# Patient Record
Sex: Male | Born: 1965 | Race: White | Hispanic: No | Marital: Married | Smoking: Never smoker
Health system: Southern US, Community
[De-identification: ages and names within clinical notes are randomized; demographics above are authoritative.]

## PROBLEM LIST (undated history)

## (undated) DIAGNOSIS — E785 Hyperlipidemia, unspecified: Secondary | ICD-10-CM

## (undated) DIAGNOSIS — N201 Calculus of ureter: Secondary | ICD-10-CM

## (undated) DIAGNOSIS — K219 Gastro-esophageal reflux disease without esophagitis: Secondary | ICD-10-CM

## (undated) DIAGNOSIS — I10 Essential (primary) hypertension: Secondary | ICD-10-CM

## (undated) DIAGNOSIS — Z87442 Personal history of urinary calculi: Secondary | ICD-10-CM

## (undated) HISTORY — DX: Essential (primary) hypertension: I10

## (undated) HISTORY — DX: Hyperlipidemia, unspecified: E78.5

---

## 1999-05-26 ENCOUNTER — Encounter: Payer: Self-pay | Admitting: *Deleted

## 1999-05-26 ENCOUNTER — Encounter: Admission: RE | Admit: 1999-05-26 | Discharge: 1999-05-26 | Payer: Self-pay | Admitting: *Deleted

## 2003-09-02 ENCOUNTER — Ambulatory Visit (HOSPITAL_BASED_OUTPATIENT_CLINIC_OR_DEPARTMENT_OTHER): Admission: RE | Admit: 2003-09-02 | Discharge: 2003-09-02 | Payer: Self-pay | Admitting: Urology

## 2003-09-02 HISTORY — PX: OTHER SURGICAL HISTORY: SHX169

## 2005-02-13 HISTORY — PX: EXTRACORPOREAL SHOCK WAVE LITHOTRIPSY: SHX1557

## 2008-08-13 ENCOUNTER — Ambulatory Visit (HOSPITAL_COMMUNITY): Admission: RE | Admit: 2008-08-13 | Discharge: 2008-08-13 | Payer: Self-pay | Admitting: Urology

## 2010-05-29 IMAGING — CR DG ABDOMEN 1V
1 series · 1 of 1 positions shown · non-contrast
Comparison: None

CLINICAL DATA: Preop, left ureteral stone

ABDOMEN - 1 VIEW

[t abdomen supine]
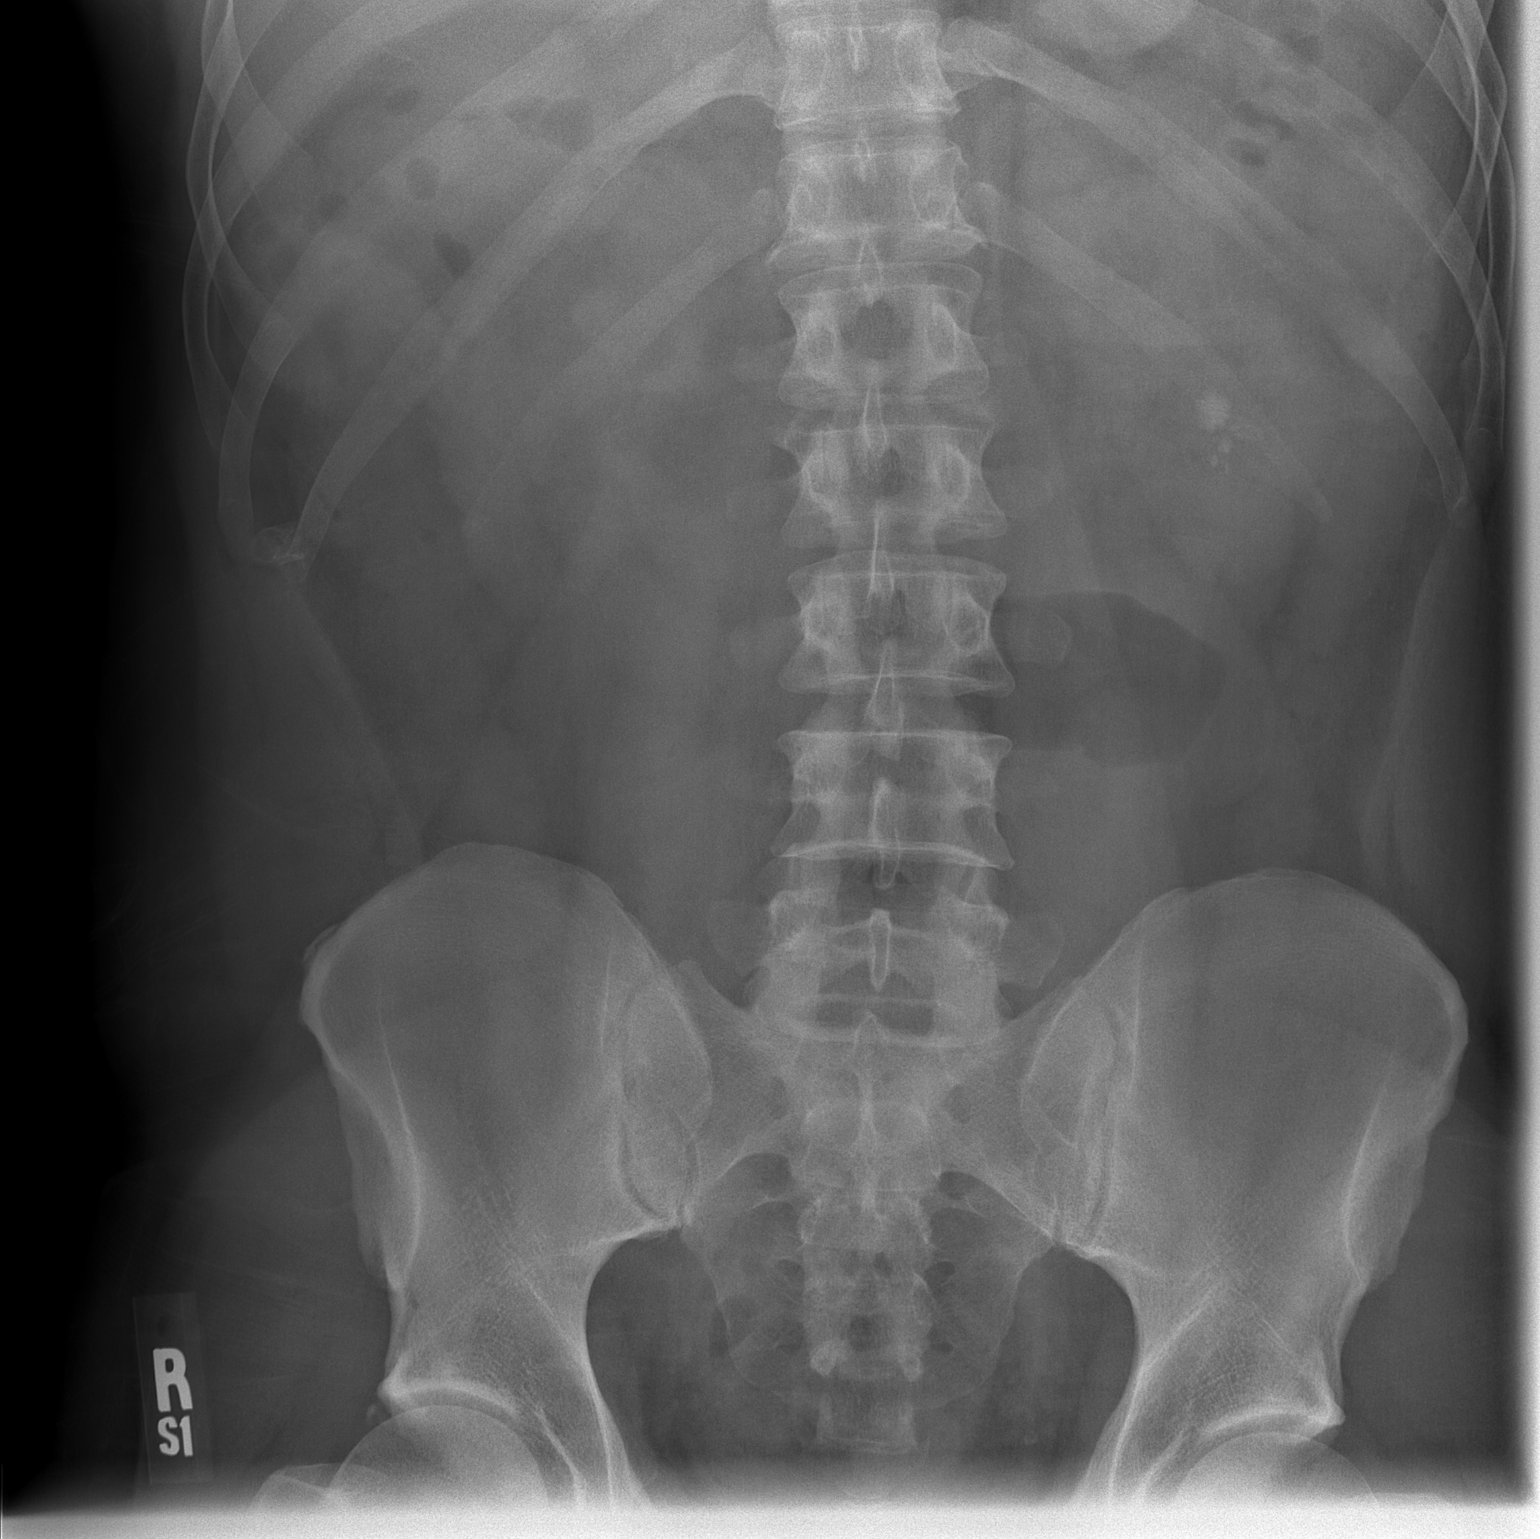

[1 of 1 positions shown; findings below may reference images not displayed]

FINDINGS: There is a cluster of calculi within the lower pole of
the left kidney the largest of which measures 11 mm.  No evidence
of ureteral lithiasis.  The most inferior pelvis is excluded from
the film.  No evidence of right renal calculi.
IMPRESSION: Left nephrolithiasis.

## 2010-07-01 NOTE — Op Note (Signed)
NAMELADON, VANDENBERGHE                           ACCOUNT NO.:  [id number]   MEDICAL RECORD NO.:  [id number]                   PATIENT TYPE:  AMB   LOCATION:  [facility]                                 FACILITY:  [facility]   PHYSICIAN:  Maretta Bees. Vonita Moss, M.D.             DATE OF BIRTH:  12/16/1965   DATE OF PROCEDURE:  09/02/2003  DATE OF DISCHARGE:                                 OPERATIVE REPORT   PREOPERATIVE DIAGNOSES:  Left ureteral calculus.   POSTOPERATIVE DIAGNOSES:  Left ureteral calculus.   PROCEDURE:  Cystoscopy, left ureteroscopy, stone manipulation, left  retrograde pyelogram with interpretation, left double J catheter placement.   SURGEON:  Maretta Bees. Vonita Moss, M.D.   ANESTHESIA:  General.   INDICATIONS FOR PROCEDURE:  This 45 year old white male has had left flank  pain which began earlier this week and he had a 5 mm stone in the distal  left ureter. He has passed some tiny stone fragments but has had persistent  flank pain and is brought to the OR today for further evaluation.   DESCRIPTION OF PROCEDURE:  The patient is brought to the operating room,  placed in lithotomy position, external genitalia were prepped and draped in  the usual fashion. He was cystoscoped, the anterior urethra and prostatic  urethra were unremarkable.  A very tiny stone fragment was seen floating in  the bladder.  I then passed a guidewire up the distal left ureter and with  the guidewire in place, I inserted the 6 French ureteroscope and saw no  stones in the distal ureter. Under fluoroscopic control, I advanced the  guidewire and it ran into a small linear stone in the very upper left  ureter. The metal guidewire would not go pass the stone but after using an  open ended ureteral catheter placed over the guidewire and then using a  Glidewire, the wire was advanced in the renal pelvis and the stone was  manipulated back into the renal pelvis. I then reinserted the ureteroscope  and went all the way  up just short of the UPJ and did not see the stone and  then passed a nitinol stone basket into the renal pelvis and brought it down  and did not retrieve any stony material with that maneuver. I felt the stone  was now manipulated back in the renal pelvis and has a cluster of stones and  elected at this point to put in a 6 [place] multicoil Kwart ureteral catheter  which I did which coiled nicely in the renal pelvis with a full coil in the  bladder and a string brought out per urethra after which the cystoscope  which I had reinserted was used to drain the bladder and it was subsequently  removed. The patient was sent to the recovery room in good condition with  the strings and double J taped to the dorsum of the penis.  Maretta Bees. Vonita Moss, M.D.    LJP/MEDQ  D:  09/02/2003  T:  09/02/2003  Job:  [id number]

## 2014-12-16 ENCOUNTER — Other Ambulatory Visit: Payer: Self-pay | Admitting: Family Medicine

## 2014-12-16 ENCOUNTER — Ambulatory Visit
Admission: RE | Admit: 2014-12-16 | Discharge: 2014-12-16 | Disposition: A | Discharge status: Home / Self Care | Payer: BC Managed Care – PPO | Source: Ambulatory Visit | Attending: Family Medicine | Admitting: Family Medicine

## 2014-12-16 DIAGNOSIS — M25512 Pain in left shoulder: Secondary | ICD-10-CM

## 2015-06-21 ENCOUNTER — Other Ambulatory Visit: Payer: Self-pay | Admitting: Urology

## 2015-06-21 ENCOUNTER — Encounter (HOSPITAL_BASED_OUTPATIENT_CLINIC_OR_DEPARTMENT_OTHER): Payer: Self-pay | Admitting: *Deleted

## 2015-06-21 NOTE — Progress Notes (Signed)
NPO AFTER MN.  ARRIVE AT [id number].  NEEDS HG AND KUB.  MAY TAKE PAIN RX AM DOS W/ SIPS OF WATER .

## 2015-06-28 ENCOUNTER — Encounter (HOSPITAL_BASED_OUTPATIENT_CLINIC_OR_DEPARTMENT_OTHER): Payer: Self-pay | Admitting: *Deleted

## 2015-06-28 ENCOUNTER — Ambulatory Visit (HOSPITAL_BASED_OUTPATIENT_CLINIC_OR_DEPARTMENT_OTHER)
Admission: RE | Admit: 2015-06-28 | Discharge: 2015-06-28 | Disposition: A | Discharge status: Home / Self Care | Payer: BC Managed Care – PPO | Source: Ambulatory Visit | Attending: Urology | Admitting: Urology

## 2015-06-28 ENCOUNTER — Ambulatory Visit (HOSPITAL_BASED_OUTPATIENT_CLINIC_OR_DEPARTMENT_OTHER): Payer: BC Managed Care – PPO | Admitting: Anesthesiology

## 2015-06-28 ENCOUNTER — Encounter (HOSPITAL_BASED_OUTPATIENT_CLINIC_OR_DEPARTMENT_OTHER)
Admission: RE | Disposition: A | Discharge status: Home / Self Care | Payer: Self-pay | Source: Ambulatory Visit | Attending: Urology

## 2015-06-28 ENCOUNTER — Ambulatory Visit (HOSPITAL_COMMUNITY): Payer: BC Managed Care – PPO

## 2015-06-28 DIAGNOSIS — N201 Calculus of ureter: Secondary | ICD-10-CM | POA: Diagnosis present

## 2015-06-28 DIAGNOSIS — Z79899 Other long term (current) drug therapy: Secondary | ICD-10-CM | POA: Diagnosis not present

## 2015-06-28 DIAGNOSIS — K219 Gastro-esophageal reflux disease without esophagitis: Secondary | ICD-10-CM | POA: Insufficient documentation

## 2015-06-28 DIAGNOSIS — Z87442 Personal history of urinary calculi: Secondary | ICD-10-CM | POA: Insufficient documentation

## 2015-06-28 DIAGNOSIS — Z841 Family history of disorders of kidney and ureter: Secondary | ICD-10-CM | POA: Diagnosis not present

## 2015-06-28 DIAGNOSIS — Z79891 Long term (current) use of opiate analgesic: Secondary | ICD-10-CM | POA: Diagnosis not present

## 2015-06-28 HISTORY — PX: CYSTOSCOPY WITH RETROGRADE PYELOGRAM, URETEROSCOPY AND STENT PLACEMENT: SHX5789

## 2015-06-28 HISTORY — DX: Personal history of urinary calculi: Z87.442

## 2015-06-28 HISTORY — DX: Gastro-esophageal reflux disease without esophagitis: K21.9

## 2015-06-28 HISTORY — DX: Calculus of ureter: N20.1

## 2015-06-28 LAB — POCT HEMOGLOBIN-HEMACUE: Hemoglobin: 14.1 g/dL (ref 13.0–17.0)

## 2015-06-28 SURGERY — CYSTOURETEROSCOPY, WITH RETROGRADE PYELOGRAM AND STENT INSERTION
Anesthesia: General | Site: Ureter | Laterality: Left

## 2015-06-28 MED ORDER — DEXAMETHASONE SODIUM PHOSPHATE 10 MG/ML IJ SOLN
INTRAMUSCULAR | Status: AC
  Filled 2015-06-28: qty 1

## 2015-06-28 MED ORDER — KETOROLAC TROMETHAMINE 30 MG/ML IJ SOLN
INTRAMUSCULAR | Status: DC | PRN
  Administered 2015-06-28: 30 mg via INTRAVENOUS

## 2015-06-28 MED ORDER — CIPROFLOXACIN IN D5W 400 MG/200ML IV SOLN
INTRAVENOUS | Status: AC
  Filled 2015-06-28: qty 200

## 2015-06-28 MED ORDER — TAMSULOSIN HCL 0.4 MG PO CAPS
0.4000 mg | ORAL_CAPSULE | Freq: Once | ORAL | Status: AC
  Administered 2015-06-28: 0.4 mg via ORAL
  Filled 2015-06-28: qty 1

## 2015-06-28 MED ORDER — SODIUM CHLORIDE 0.9 % IR SOLN
Status: DC | PRN
  Administered 2015-06-28: 3000 mL
  Administered 2015-06-28: 1000 mL

## 2015-06-28 MED ORDER — MIDAZOLAM HCL 5 MG/5ML IJ SOLN
INTRAMUSCULAR | Status: DC | PRN
  Administered 2015-06-28: 2 mg via INTRAVENOUS

## 2015-06-28 MED ORDER — KETOROLAC TROMETHAMINE 30 MG/ML IJ SOLN
INTRAMUSCULAR | Status: AC
  Filled 2015-06-28: qty 1

## 2015-06-28 MED ORDER — IOPAMIDOL (ISOVUE-370) INJECTION 76%
INTRAVENOUS | Status: DC | PRN
  Administered 2015-06-28: 9 mL

## 2015-06-28 MED ORDER — PHENAZOPYRIDINE HCL 200 MG PO TABS
200.0000 mg | ORAL_TABLET | Freq: Once | ORAL | Status: AC
  Administered 2015-06-28: 200 mg via ORAL
  Filled 2015-06-28: qty 1

## 2015-06-28 MED ORDER — LIDOCAINE HCL (CARDIAC) 20 MG/ML IV SOLN
INTRAVENOUS | Status: DC | PRN
  Administered 2015-06-28: 60 mg via INTRAVENOUS

## 2015-06-28 MED ORDER — LIDOCAINE HCL 2 % EX GEL
CUTANEOUS | Status: DC | PRN
  Administered 2015-06-28: 1 via URETHRAL

## 2015-06-28 MED ORDER — FENTANYL CITRATE (PF) 100 MCG/2ML IJ SOLN
INTRAMUSCULAR | Status: DC | PRN
  Administered 2015-06-28: 75 ug via INTRAVENOUS
  Administered 2015-06-28: 25 ug via INTRAVENOUS

## 2015-06-28 MED ORDER — LACTATED RINGERS IV SOLN
INTRAVENOUS | Status: DC
  Administered 2015-06-28 (×2): via INTRAVENOUS
  Filled 2015-06-28: qty 1000

## 2015-06-28 MED ORDER — ONDANSETRON HCL 4 MG/2ML IJ SOLN
INTRAMUSCULAR | Status: AC
  Filled 2015-06-28: qty 2

## 2015-06-28 MED ORDER — OXYCODONE HCL 5 MG PO TABS
10.0000 mg | ORAL_TABLET | ORAL | Status: DC | PRN
  Filled 2015-06-28: qty 2

## 2015-06-28 MED ORDER — MIDAZOLAM HCL 2 MG/2ML IJ SOLN
INTRAMUSCULAR | Status: AC
  Filled 2015-06-28: qty 2

## 2015-06-28 MED ORDER — LIDOCAINE HCL (CARDIAC) 20 MG/ML IV SOLN
INTRAVENOUS | Status: AC
  Filled 2015-06-28: qty 5

## 2015-06-28 MED ORDER — CIPROFLOXACIN IN D5W 400 MG/200ML IV SOLN
400.0000 mg | INTRAVENOUS | Status: AC
  Administered 2015-06-28: 400 mg via INTRAVENOUS
  Filled 2015-06-28: qty 200

## 2015-06-28 MED ORDER — PROPOFOL 10 MG/ML IV BOLUS
INTRAVENOUS | Status: DC | PRN
  Administered 2015-06-28: 190 mg via INTRAVENOUS

## 2015-06-28 MED ORDER — TAMSULOSIN HCL 0.4 MG PO CAPS
ORAL_CAPSULE | ORAL | Status: AC
  Filled 2015-06-28: qty 1

## 2015-06-28 MED ORDER — FENTANYL CITRATE (PF) 100 MCG/2ML IJ SOLN
INTRAMUSCULAR | Status: AC
  Filled 2015-06-28: qty 2

## 2015-06-28 MED ORDER — DEXAMETHASONE SODIUM PHOSPHATE 4 MG/ML IJ SOLN
INTRAMUSCULAR | Status: DC | PRN
  Administered 2015-06-28: 10 mg via INTRAVENOUS

## 2015-06-28 MED ORDER — PHENAZOPYRIDINE HCL 200 MG PO TABS: 200.0000 mg | ORAL_TABLET | Freq: Three times a day (TID) | ORAL | Status: DC | PRN

## 2015-06-28 MED ORDER — PHENAZOPYRIDINE HCL 100 MG PO TABS
ORAL_TABLET | ORAL | Status: AC
  Filled 2015-06-28: qty 2

## 2015-06-28 MED ORDER — PROPOFOL 10 MG/ML IV BOLUS
INTRAVENOUS | Status: AC
  Filled 2015-06-28: qty 20

## 2015-06-28 MED ORDER — ONDANSETRON HCL 4 MG/2ML IJ SOLN
INTRAMUSCULAR | Status: DC | PRN
  Administered 2015-06-28: 4 mg via INTRAVENOUS

## 2015-06-28 SURGICAL SUPPLY — 32 items
ADAPTER CATH URET PLST 4-6FR (CATHETERS) IMPLANT
BAG DRAIN URO-CYSTO SKYTR STRL (DRAIN) ×3 IMPLANT
BASKET DAKOTA 1.9FR 11X120 (BASKET) ×3 IMPLANT
BASKET LASER NITINOL 1.9FR (BASKET) IMPLANT
BASKET ZERO TIP NITINOL 2.4FR (BASKET) IMPLANT
CATH INTERMIT  6FR 70CM (CATHETERS) ×3 IMPLANT
CATH URET 5FR 28IN CONE TIP (BALLOONS)
CATH URET 5FR 70CM CONE TIP (BALLOONS) IMPLANT
CLOTH BEACON ORANGE TIMEOUT ST (SAFETY) ×3 IMPLANT
FIBER LASER TRAC TIP (UROLOGICAL SUPPLIES) ×3 IMPLANT
GLOVE BIO SURGEON STRL SZ8 (GLOVE) ×3 IMPLANT
GLOVE SURG SS PI 7.5 STRL IVOR (GLOVE) ×6 IMPLANT
GOWN STRL REUS W/ TWL LRG LVL3 (GOWN DISPOSABLE) ×1 IMPLANT
GOWN STRL REUS W/ TWL XL LVL3 (GOWN DISPOSABLE) ×1 IMPLANT
GOWN STRL REUS W/TWL LRG LVL3 (GOWN DISPOSABLE) ×2
GOWN STRL REUS W/TWL XL LVL3 (GOWN DISPOSABLE) ×5 IMPLANT
GUIDEWIRE 0.038 PTFE COATED (WIRE) IMPLANT
GUIDEWIRE ANG ZIPWIRE 038X150 (WIRE) IMPLANT
GUIDEWIRE STR DUAL SENSOR (WIRE) ×3 IMPLANT
IV NS 1000ML (IV SOLUTION) ×2
IV NS 1000ML BAXH (IV SOLUTION) ×1 IMPLANT
IV NS IRRIG 3000ML ARTHROMATIC (IV SOLUTION) ×3 IMPLANT
KIT BALLIN UROMAX 15FX10 (LABEL) IMPLANT
KIT ROOM TURNOVER WOR (KITS) ×3 IMPLANT
MANIFOLD NEPTUNE II (INSTRUMENTS) ×3 IMPLANT
PACK CYSTO (CUSTOM PROCEDURE TRAY) ×3 IMPLANT
SET HIGH PRES BAL DIL (LABEL)
SHEATH ACCESS URETERAL 38CM (SHEATH) ×3 IMPLANT
STENT POLARIS 5FRX26 (STENTS) ×3 IMPLANT
TUBE CONNECTING 12'X1/4 (SUCTIONS) ×1
TUBE CONNECTING 12X1/4 (SUCTIONS) ×2 IMPLANT
WATER STERILE IRR 3000ML UROMA (IV SOLUTION) IMPLANT

## 2015-06-28 NOTE — Anesthesia Procedure Notes (Signed)
Procedure Name: LMA Insertion Date/Time: 06/28/2015 8:42 AM Performed by: Norva PavlovALLAWAY, Mahayla Haddaway G Pre-anesthesia Checklist: Patient identified, Timeout performed, Emergency Drugs available, Suction available and Patient being monitored Patient Re-evaluated:Patient Re-evaluated prior to inductionOxygen Delivery Method: Circle system utilized Preoxygenation: Pre-oxygenation with 100% oxygen Intubation Type: IV induction Ventilation: Mask ventilation without difficulty LMA: LMA inserted LMA Size: 4.0 Number of attempts: 1 Placement Confirmation: positive ETCO2 and breath sounds checked- equal and bilateral Tube secured with: Tape Dental Injury: Teeth and Oropharynx as per pre-operative assessment

## 2015-06-28 NOTE — Anesthesia Postprocedure Evaluation (Signed)
Anesthesia Post Note  Patient: Elita Quickdward B Revak  Procedure(s) Performed: Procedure(s) (LRB): CYSTOSCOPY WITH LEFT RETROGRADE PYELOGRAM, URETEROSCOPY LASER LITHO  AND STENT PLACEMENT (Left)  Patient location during evaluation: PACU Anesthesia Type: General Level of consciousness: awake and alert Pain management: pain level controlled Vital Signs Assessment: post-procedure vital signs reviewed and stable Respiratory status: spontaneous breathing, nonlabored ventilation, respiratory function stable and patient connected to nasal cannula oxygen Cardiovascular status: blood pressure returned to baseline and stable Postop Assessment: no signs of nausea or vomiting Anesthetic complications: no    Last Vitals:  Filed Vitals:   06/28/15 1015 06/28/15 1030  BP: 122/82 135/91  Pulse: 54 56  Temp:  36.7 C  Resp: 14 16    Last Pain:  Filed Vitals:   06/28/15 1039  PainSc: 0-No pain                 Cecile HearingStephen Wake Caitlyne Ingham

## 2015-06-28 NOTE — Anesthesia Preprocedure Evaluation (Addendum)
Anesthesia Evaluation  Patient identified by MRN, date of birth, ID band Patient awake    Reviewed: Allergy & Precautions, NPO status , Patient's Chart, lab work & pertinent test results  Airway Mallampati: II  TM Distance: >3 FB Neck ROM: Full    Dental  (+) Teeth Intact, Dental Advisory Given   Pulmonary neg pulmonary ROS,    Pulmonary exam normal breath sounds clear to auscultation       Cardiovascular Exercise Tolerance: Good negative cardio ROS Normal cardiovascular exam Rhythm:Regular Rate:Normal     Neuro/Psych negative neurological ROS  negative psych ROS   GI/Hepatic Neg liver ROS, GERD  Medicated,  Endo/Other  negative endocrine ROS  Renal/GU negative Renal ROSKidney stones     Musculoskeletal negative musculoskeletal ROS (+)   Abdominal   Peds  Hematology negative hematology ROS (+)   Anesthesia Other Findings Day of surgery medications reviewed with the patient.  Reproductive/Obstetrics                            Anesthesia Physical Anesthesia Plan  ASA: II  Anesthesia Plan: General   Post-op Pain Management:    Induction: Intravenous  Airway Management Planned: LMA  Additional Equipment:   Intra-op Plan:   Post-operative Plan: Extubation in OR  Informed Consent: I have reviewed the patients History and Physical, chart, labs and discussed the procedure including the risks, benefits and alternatives for the proposed anesthesia with the patient or authorized representative who has indicated his/her understanding and acceptance.   Dental advisory given  Plan Discussed with: CRNA  Anesthesia Plan Comments: (Risks/benefits of general anesthesia discussed with patient including risk of damage to teeth, lips, gum, and tongue, nausea/vomiting, allergic reactions to medications, and the possibility of heart attack, stroke and death.  All patient questions answered.   Patient wishes to proceed.)        Anesthesia Quick Evaluation

## 2015-06-28 NOTE — Op Note (Signed)
PATIENT:  Jacob Weiss  PRE-OPERATIVE DIAGNOSIS:  1. Left ureteral calculi. 2. Left renal calculi  POST-OPERATIVE DIAGNOSIS: Same  PROCEDURE:  1. Cystoscopy with left retrograde pyelogram including interpretation. 2. Left ureteroscopy with laser lithotripsy and stone extraction of left ureteral stones 3. Left renal stone extraction 4. Left double-J stent placement  SURGEON: Garnett Farm, MD  INDICATION: Jacob Weiss is a 50 year old male with a history of calculus disease who developed flank pain that was quite severe and was found to have a small left ureteral stone in the proximal ureter that had a good chance of spontaneous passage however on follow-up a second stone that was previously located in the kidney had moved into the proximal ureter as well. It was much larger. We therefore discussed the rationale behind ureteroscopy rather than lithotripsy and the ability to then addressed his remaining renal calculi. He understands and has elected to proceed with ureteroscopy.  ANESTHESIA:  General  EBL:  Minimal  DRAINS: 5 French, 26 cm Polaris stent in the left ureter (with string)  SPECIMEN:  Stone given to patient  DESCRIPTION OF PROCEDURE: The patient was taken to the major OR and placed on the table. General anesthesia was administered and then the patient was moved to the dorsal lithotomy position. The genitalia was sterilely prepped and draped. An official timeout was performed.  Initially the 23 French cystoscope with 30 lens was passed under direct vision into the bladder. The bladder was then fully inspected. It was noted be free of any tumors, stones or inflammatory lesions. Ureteral orifices were of normal configuration and position. A 6 French open-ended ureteral catheter was then passed through the cystoscope into the ureteral orifice in order to perform a left retrograde pyelogram.  A retrograde pyelogram was performed by injecting full-strength contrast up the left ureter  under direct fluoroscopic control. It revealed a filling defect in the proximal ureter consistent with the stone seen on the preoperative KUB. The remainder of the ureter was noted to be normal as was the intrarenal collecting system. I then passed a 0.038 inch floppy-tipped guidewire through the open ended catheter and into the area of the renal pelvis and this was left in place. The inner portion of a ureteral access sheath was then passed over the guidewire to gently dilate the intramural ureter. I then assembled the ureteral access sheath and passed the sheath with inner cannula over the guidewire into the upper ureter and removed the guidewire and inner cannula.  A 6 French flexible, digital ureteroscope was then passed through the access sheath and as I got to the end of the access sheath identified the smaller, distal stone. I engaged the stone with a Twisp basket and withdrew it to the access sheath but could not negotiate the stone into the sheath and therefore elected to proceed with laser lithotripsy. The 200  holmium laser fiber was used to fragment the stone. I then used the Obetz basket to extract all of the stone fragments. I then advanced the scope beyond where the first stone was located and noted the more proximal, larger stone. It was also broken into approximately 4 pieces which were extracted with the basket as well.  I then advanced the scope up into the kidney and performed a systematic inspection of each calyx. I first noted there were no tumors or worrisome lesions. I noted 2 stones in a lower pole calyx that were free-floating in these were grasped individually with the basket and removed. A third  stone was found in the middle pole calyx and it was grasped and removed as well. I then reinspected all of the calyces and noted no further stones present. There was a small, nearly pinpoint opening in a lower pole calyx that may have been a stenosed infundibulum. I did not elect to perform  any procedure on this. The remainder of the collecting system was noted to be completely free of all stones. I then backed the scope down the ureter and reinspection of the ureter ureteroscopically revealed no further stone fragments and no injury to the ureter. I therefore passed a guidewire through the ureteroscope and into the left kidney under fluoroscopy and left this in place while removing the ureteroscope and access sheath.   I then backloaded the cystoscope over the guidewire and passed the stent over the guidewire into the area of the renal pelvis. As the guidewire was removed good curl was noted in the renal pelvis. The bladder was drained and the cystoscope was then removed. 2% lidocaine jelly was inserted in the urethra and a penile clamp was applied. The string, affixed to the distal aspect of the stent, was affixed to the dorsum of the penis. The patient was then awakened and taken to the recovery room in stable and satisfactory condition. He tolerated the procedure well no intraoperative complications.  PLAN OF CARE: Discharge to home after PACU  PATIENT DISPOSITION:  PACU - hemodynamically stable.

## 2015-06-28 NOTE — H&P (Signed)
Jacob Weiss is a 50 year old male with a left ureteral stone.  Vasectomy: He underwent an uncomplicated vasectomy by Dr. Vonita MossPeterson in 7/00.    Calculus disease: He passed a left ureteral stone in 3/01. In 7/05 a CT scan revealed several left renal calculi and he underwent stenting but elected not to undergo lithotripsy at that time. His stent was removed.  Left renal ESWL 7/10   CT scan 06/15/15: 4.8 x 4 mm stone in his proximal left ureter seen on his scout film reconstruction located at the level of the L3 transverse process. Multiple left renal calculi were noted primarily in the lower pole.   Stone analysis: Calcium oxalate.    Interval history: He has done well since he was here with minimal discomfort. He said on 2 occasions he took a single pain pill and went to bed but otherwise has not had any difficulty. He has not seen his stone pass.   Past Medical History Problems  1. History of esophageal reflux (Z87.19) 2. History of kidney stones (H08.657(Z87.442)  Surgical History Problems  1. History of Renal Lithotripsy  Current Meds 1. Cinnamon CAPS;  Therapy: (Recorded:07May2010) to Recorded 2. Meprozine CAPS;  Therapy: (Recorded:19Feb2008) to Recorded 3. OxyCODONE HCl - 20 MG Oral Tablet; Take 1 tablet every 4 hours;  Therapy: 02May2017 to (Last Rx:02May2017) Ordered 4. Rapaflo 8 MG Oral Capsule; 1 po q day;  Therapy: 02May2017 to (Last Rx:02May2017) Ordered  Allergies Medication  1. No Known Drug Allergies  Family History Problems  1. Family history of Family Health Status Number Of Children   1 son 2. Family history of Nephrolithiasis 3. Family history of Urologic Disorder   kidney stones  Social History Problems  1. Denied: History of Alcohol Use (History) 2. Caffeine Use   3-4  per day 3. Marital History - Currently Married 4. Never smoker 5. 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 Denied: History of Tobacco Use  Review of  Systems Genitourinary, constitutional, skin, eye, otolaryngeal, hematologic/lymphatic, cardiovascular, pulmonary, endocrine, musculoskeletal, gastrointestinal, neurological and psychiatric system(s) were reviewed and pertinent findings if present are noted and are otherwise negative.    Vitals Vital Signs  Height: 6 ft 1 in Weight: 212 lb  BMI Calculated: 27.97 BSA Calculated: 2.21 Blood Pressure: 136 / 78 Heart Rate: 73  Physical Exam Constitutional: Well nourished and well developed . In acute distress.   ENT:. The ears and nose are normal in appearance.   Neck: The appearance of the neck is normal and no neck mass is present.   Pulmonary: No respiratory distress and normal respiratory rhythm and effort.   Cardiovascular: Heart rate and rhythm are normal . No peripheral edema.   Abdomen: The abdomen is soft and nontender. No masses are palpated. No CVA tenderness. No hernias are palpable. No hepatosplenomegaly noted.   Lymphatics: The femoral and inguinal nodes are not enlarged or tender.   Skin: Normal skin turgor, no visible rash and no visible skin lesions.   Neuro/Psych:. Mood and affect are appropriate.    Results/Data Urine  COLOR YELLOW  APPEARANCE CLEAR  SPECIFIC GRAVITY 1.025  pH 5.0  GLUCOSE NEGATIVE  BILIRUBIN NEGATIVE  KETONE NEGATIVE  BLOOD 1+  PROTEIN NEGATIVE  NITRITE NEGATIVE  LEUKOCYTE ESTERASE NEGATIVE  SQUAMOUS EPITHELIAL/HPF 0-5 HPF WBC 0-5 WBC/HPF RBC 3-10 RBC/HPF BACTERIA NONE SEEN HPF CRYSTALS NONE SEEN HPF CASTS NONE SEEN LPF Yeast NONE SEEN HPF KUB: The stone in his left ureter at the L3 level appears to  be unchanged in position. I do note a new calcification in the area of the renal pelvis that is larger and appears to be mobile.     Assessment   I told him that it appears his ureteral stone has not progressed significantly although fortunately he has not had a great deal of discomfort. What has occurred though is it looks like a  second stone has become dislodged and could potentially also pass. With the 2 stones in different locations I told them that I did not believe lithotripsy would be a good option as treating the distal stone may require another shocks that I could not also treat the renal pelvic stone and therefore this could cause obstruction. The more proximal stone can be treated with a distal ureteral stone and therefore I have recommended ureteroscopic management if he chooses to undergo intervention. We discussed continued observation and the risk as well as possibility that he still couldn't pass his stone. I discussed ureteroscopy with him which he has had in the past and we went over the procedure, its risks and palpitations and the fact that he will need to have a stent postoperatively. He is opposed to a stent as he had one in the past for 3 weeks. I told him that we could likely get the stent out in about 5 days which would allow him to go on his vacation stent free. He has elected to therefore proceed with ureteroscopic management of his stones.   Plan    He is scheduled for left ureteroscopy and laser lithotripsy of his left ureteral and renal calculi.

## 2015-06-28 NOTE — Transfer of Care (Signed)
Last Vitals:  Filed Vitals:   06/28/15 0712  BP: 138/92  Pulse: 60  Temp: 36.4 C  Resp: 16    Last Pain: There were no vitals filed for this visit.    Patients Stated Pain Goal: 3 (06/28/15 0754)  Immediate Anesthesia Transfer of Care Note  Patient: Jacob Weiss  Procedure(s) Performed: Procedure(s) (LRB): CYSTOSCOPY WITH LEFT RETROGRADE PYELOGRAM, URETEROSCOPY LASER LITHO  AND STENT PLACEMENT (Left)  Patient Location: PACU  Anesthesia Type: General  Level of Consciousness: awake, alert  and oriented  Airway & Oxygen Therapy: Patient Spontanous Breathing and Patient connected to nasal cannula oxygen  Post-op Assessment: Report given to PACU RN and Post -op Vital signs reviewed and stable  Post vital signs: Reviewed and stable  Complications: No apparent anesthesia complications

## 2015-06-28 NOTE — Discharge Instructions (Signed)

## 2015-06-29 ENCOUNTER — Encounter (HOSPITAL_BASED_OUTPATIENT_CLINIC_OR_DEPARTMENT_OTHER): Payer: Self-pay | Admitting: Urology

## 2021-08-17 ENCOUNTER — Ambulatory Visit (INDEPENDENT_AMBULATORY_CARE_PROVIDER_SITE_OTHER): Payer: BC Managed Care – PPO

## 2021-08-17 ENCOUNTER — Ambulatory Visit: Payer: BC Managed Care – PPO | Admitting: Podiatry

## 2021-08-17 ENCOUNTER — Encounter: Payer: Self-pay | Admitting: Podiatry

## 2021-08-17 DIAGNOSIS — M779 Enthesopathy, unspecified: Secondary | ICD-10-CM

## 2021-08-18 NOTE — Progress Notes (Signed)
Subjective:   Patient ID: Jacob Weiss, male   DOB: 56 y.o.   MRN: [id number]   HPI Patient presents stating that he has had some numbness in his left and follow-up with his right foot with occasional burning and was just concerned about this.  States its been short-term and he does not remember any injury and has no back injury currently.  Does not smoke likes to be active   Review of Systems  All other systems reviewed and are negative.       Objective:  Physical Exam Vitals and nursing note reviewed.  Constitutional:      Appearance: He is well-developed.  Pulmonary:     Effort: Pulmonary effort is normal.  Musculoskeletal:        General: Normal range of motion.  Skin:    General: Skin is warm.  Neurological:     Mental Status: He is alert.     Neurovascular status was found to be intact muscle strength was found to be adequate range of motion adequate.  Patient does complain about this but states it does not stop him from doing anything he has had no falls he has no balance issues and he has no trouble sleeping with only periodic discomfort which occurs but mostly just the feeling that it provides.  Good digital perfusion well oriented x3     Assessment:  Difficult to say but most likely some type of a transitory irritation which may be related to some kind of a low-grade back compression or some kind of a contusion of a subtle nature which may have occurred     Plan:  H&P spent a great deal time going over this with him and as long as it does not progress as long as it does not interfere with daily activities and as long as he does not develop further burning shooting or instability or muscle strength loss I would just put this on hold and if those things were to occur I will refer to neurologist for nerve conduction studies EMGs.  Patient will be seen back as needed  X-rays were negative for signs of arthritis signs of fracture or other bony pathology

## 2022-05-02 ENCOUNTER — Encounter: Payer: Self-pay | Admitting: Cardiology

## 2022-05-02 ENCOUNTER — Ambulatory Visit: Payer: BC Managed Care – PPO | Admitting: Cardiology

## 2022-05-02 VITALS — BP 140/82 | HR 78 | Resp 18 | Ht 73.0 in | Wt 223.8 lb

## 2022-05-02 DIAGNOSIS — R0609 Other forms of dyspnea: Secondary | ICD-10-CM

## 2022-05-02 DIAGNOSIS — E782 Mixed hyperlipidemia: Secondary | ICD-10-CM

## 2022-05-02 DIAGNOSIS — I10 Essential (primary) hypertension: Secondary | ICD-10-CM

## 2022-05-02 NOTE — Progress Notes (Signed)
ID:  Jacob Weiss, DOB [birth year], MRN [id number]  PCP:  Antony Contras, MD  Cardiologist:  Rex Kras, DO, [facility] (established care 05/02/2022)  REASON FOR CONSULT: Hypertension  REQUESTING PHYSICIAN:  Antony Contras, MD [place],  [place] [id number]  Chief Complaint  Patient presents with   Hypertension   New Patient (Initial Visit)    HPI  Jacob Weiss is a 57 y.o. Caucasian male who presents to the clinic for evaluation of hypertension at the request of Antony Contras, MD. His past medical history and cardiovascular risk factors include: Hypertension, paresthesia, GERD, hyperlipidemia.   Patient was referred to the practice for evaluation of benign essential hypertension.  He has had high blood pressure for several years and has been on hydrochlorothiazide and losartan.  His PCP has tried to uptitrate antihypertensive medications for better blood pressure goal however he has been having episodes of dizziness with up titration of losartan and/or HCTZ.  Once he reduced losartan dose back to 50 mg p.o. daily the symptoms of dizziness resolved.  He has been having some stress lately at work which may be contributory to uncontrolled hypertension.  Home blood pressure range between 127-130 mmHg, and diastolic blood pressures between 85-90 mmHg.  This is per patient I do not have a blood pressure log to review.  He is explicitly denies chest pain at rest or with effort related activities.  At times when he does go up flights of stairs he has short of breath.  However, he feels comfortable mowing his yard with a push mower.  Of note, he takes both of his blood pressures in the morning and checks his blood pressures at night.  FUNCTIONAL STATUS: Walks approximately 2 miles per week.  And maintain his lawn by using a push mower.  ALLERGIES: No Known Allergies  MEDICATION LIST PRIOR TO VISIT: Current Meds  Medication Sig   chlorpheniramine (CHLOR-TRIMETON) 4  MG tablet Take 4 mg by mouth 2 (two) times daily as needed for allergies.   famotidine (PEPCID) 20 MG tablet Take 20 mg by mouth daily.   hydrochlorothiazide (HYDRODIURIL) 12.5 MG tablet Take 12.5 mg by mouth daily.   losartan (COZAAR) 50 MG tablet Take 50 mg by mouth daily.   [DISCONTINUED] famotidine (PEPCID AC) 10 MG chewable tablet Chew 10 mg by mouth as needed for heartburn.     PAST MEDICAL HISTORY: Past Medical History:  Diagnosis Date   GERD (gastroesophageal reflux disease)    History of kidney stones    Hyperlipidemia    Hypertension    Left ureteral stone     PAST SURGICAL HISTORY: Past Surgical History:  Procedure Laterality Date   CYSTOSCOPY WITH RETROGRADE PYELOGRAM, URETEROSCOPY AND STENT PLACEMENT Left 06/28/2015   Procedure: CYSTOSCOPY WITH LEFT RETROGRADE PYELOGRAM, URETEROSCOPY LASER LITHO  AND STENT PLACEMENT;  Surgeon: Kathie Rhodes, MD;  Location: [place];  Service: Urology;  Laterality: Left;   EXTRACORPOREAL SHOCK WAVE LITHOTRIPSY  2007   LEFT URETEROSCOPIC STONE EXTRACTION W/  STENT PLACEMENT  09-02-2003    FAMILY HISTORY: The patient family history includes Hypertension in his mother; Kidney cancer in his father; Lung cancer in his sister; Stroke in his brother.  SOCIAL HISTORY:  The patient  reports that he has never smoked. He has never used smokeless tobacco. He reports that he does not drink alcohol and does not use drugs.  REVIEW OF SYSTEMS: Review of Systems  Cardiovascular:  Positive for dyspnea on exertion.  Negative for chest pain, claudication, irregular heartbeat, leg swelling, near-syncope, orthopnea, palpitations, paroxysmal nocturnal dyspnea and syncope.  Respiratory:  Negative for shortness of breath.   Hematologic/Lymphatic: Negative for bleeding problem.  Musculoskeletal:  Negative for muscle cramps and myalgias.  Neurological:  Negative for dizziness and light-headedness.    PHYSICAL EXAM:    05/02/2022    3:28 PM  05/02/2022    2:51 PM 06/28/2015   12:00 PM  Vitals with BMI  Height  6\' 1"    Weight  223 lbs 13 oz   BMI  [id number]   Systolic [id number]  Diastolic 82 87 96  Pulse 78 76 59    Physical Exam  Constitutional: No distress.  Age appropriate, hemodynamically stable.   Neck: No JVD present.  Cardiovascular: Normal rate, regular rhythm, S1 normal, S2 normal, intact distal pulses and normal pulses. Exam reveals no gallop, no S3 and no S4.  No murmur heard. Pulmonary/Chest: Effort normal and breath sounds normal. No stridor. He has no wheezes. He has no rales.  Abdominal: Soft. Bowel sounds are normal. He exhibits no distension. There is no abdominal tenderness.  Musculoskeletal:        General: No edema.     Cervical back: Neck supple.  Neurological: He is alert and oriented to person, place, and time. He has intact cranial nerves (2-12).  Skin: Skin is warm and moist.   CARDIAC DATABASE: EKG: 05/02/2022: Sinus rhythm, 71 bpm, left axis, left anterior fascicular block, LAE, without underlying injury pattern.  Echocardiogram: No results found for this or any previous visit from the past 1095 days.    Stress Testing: No results found for this or any previous visit from the past 1095 days.   Heart Catheterization: None  LABORATORY DATA: External Labs: Collected: 12//2023. Total cholesterol 230, triglycerides 79, HDL 66, LDL calculated 150, non-HDL 164. TSH 1.9. BUN 18, creatinine 1.06. Sodium 140, potassium 4, chloride 101, bicarb 31. AST 24, ALT 24, alkaline phosphatase 56. Hemoglobin 14.1, hematocrit 42.4%  IMPRESSION:    ICD-10-CM   1. Benign hypertension  I10 EKG 12-Lead    PCV ECHOCARDIOGRAM COMPLETE    2. Dyspnea on exertion  R06.09 PCV ECHOCARDIOGRAM COMPLETE    CT CARDIAC SCORING ([facility] LOCATIONS ONLY)    PCV CARDIAC STRESS TEST    3. Mixed hyperlipidemia  E78.2        RECOMMENDATIONS: Jacob Weiss is a 57 y.o. Caucasian male whose past medical history and  cardiac risk factors include: Hypertension, paresthesia, GERD, hyperlipidemia.   Benign hypertension Office blood pressures are not at goal. I suspect that his overall blood pressures need to improve. However, he is experiencing lightheadedness and dizziness with up titration of medical therapy. This is likely secondary to him taking all of his antihypertensive medications in the morning. For now I have asked him to take HCTZ in the morning and losartan at night at the current doses and to check his blood pressures twice daily to see what his blood pressures trend in an ambulatory setting. If SBP is not at goal uptitrating 1 medication at a time is quite reasonable. Reemphasized importance of a low-salt diet.  Dyspnea on exertion Likely secondary to uncontrolled hypertension; however, given his cardiovascular risk factors and age recommend echocardiogram to evaluate for LVEF and structural heart disease, exercise treadmill stress test to evaluate for functional capacity and exercise-induced ischemia, and coronary calcium score for further risk stratification.  Mixed hyperlipidemia Most recent lipid profile notes a calculated LDL  at 150 mg/dL Chooses not to be on statin therapy at this time. His ASCVD risk score is approximately 6.3% based on current parameters.  Will use a coronary calcium score for further risk stratification.  He will discuss further with PCP.  Data Reviewed: I have independently reviewed external notes provided by the referring provider as part of this office visit.   I have independently reviewed results of labs, EKG as part of medical decision making. I have ordered the following tests:  Orders Placed This Encounter  Procedures   CT CARDIAC SCORING ([facility] LOCATIONS ONLY)    Standing Status:   Future    Standing Expiration Date:   05/02/2023    Order Specific Question:   Preferred imaging location?    Answer:   [facility]    Order Specific Question:   Release to patient     Answer:   Immediate   PCV CARDIAC STRESS TEST    Standing Status:   Future    Standing Expiration Date:   05/02/2023   EKG 12-Lead   PCV ECHOCARDIOGRAM COMPLETE    Standing Status:   Future    Standing Expiration Date:   05/02/2023  I have not made medications changes at today's encounter as noted above. History of present illness was obtained by both patient and wife at today's office visit.   FINAL MEDICATION LIST END OF ENCOUNTER: No orders of the defined types were placed in this encounter.   Medications Discontinued During This Encounter  Medication Reason   silodosin (RAPAFLO) 4 MG CAPS capsule    phenazopyridine (PYRIDIUM) 200 MG tablet    oxyCODONE-acetaminophen (PERCOCET/ROXICET) 5-325 MG tablet    famotidine (PEPCID AC) 10 MG chewable tablet      Current Outpatient Medications:    chlorpheniramine (CHLOR-TRIMETON) 4 MG tablet, Take 4 mg by mouth 2 (two) times daily as needed for allergies., Disp: , Rfl:    famotidine (PEPCID) 20 MG tablet, Take 20 mg by mouth daily., Disp: , Rfl:    hydrochlorothiazide (HYDRODIURIL) 12.5 MG tablet, Take 12.5 mg by mouth daily., Disp: , Rfl:    losartan (COZAAR) 50 MG tablet, Take 50 mg by mouth daily., Disp: , Rfl:   Orders Placed This Encounter  Procedures   CT CARDIAC SCORING ([facility] LOCATIONS ONLY)   PCV CARDIAC STRESS TEST   EKG 12-Lead   PCV ECHOCARDIOGRAM COMPLETE   There are no Patient Instructions on file for this visit.   --Continue cardiac medications as reconciled in final medication list. --Return in about 6 weeks (around 06/13/2022) for Follow up, Dyspnea, BP. or sooner if needed. --Continue follow-up with your primary care physician regarding the management of your other chronic comorbid conditions.  Patient's questions and concerns were addressed to his satisfaction. He voices understanding of the instructions provided during this encounter.   This note was created using a voice recognition software as a result there  may be grammatical errors inadvertently enclosed that do not reflect the nature of this encounter. Every attempt is made to correct such errors.  Rex Kras, [place], [facility]  Pager:  [phone] Office: [phone]

## 2022-05-18 ENCOUNTER — Ambulatory Visit: Payer: BC Managed Care – PPO

## 2022-05-18 DIAGNOSIS — R0609 Other forms of dyspnea: Secondary | ICD-10-CM

## 2022-05-26 ENCOUNTER — Other Ambulatory Visit: Payer: BC Managed Care – PPO

## 2022-06-15 ENCOUNTER — Ambulatory Visit: Payer: BC Managed Care – PPO | Admitting: Cardiology

## 2022-06-15 ENCOUNTER — Encounter: Payer: Self-pay | Admitting: Cardiology

## 2022-06-15 VITALS — BP 122/83 | HR 79 | Resp 17 | Ht 73.0 in | Wt 223.2 lb

## 2022-06-15 DIAGNOSIS — R0609 Other forms of dyspnea: Secondary | ICD-10-CM

## 2022-06-15 DIAGNOSIS — I10 Essential (primary) hypertension: Secondary | ICD-10-CM

## 2022-06-15 DIAGNOSIS — E782 Mixed hyperlipidemia: Secondary | ICD-10-CM

## 2022-06-15 NOTE — Progress Notes (Signed)
ID:  Jacob Weiss, DOB April 24, 1965, MRN [id number]  PCP:  Tally Joe, MD  Cardiologist:  Tessa Lerner, DO, [facility] (established care 05/02/2022)  Date: 06/15/22 Last Office Visit: 05/02/2022  Chief Complaint  Patient presents with   Shortness of Breath   Hypertension   Follow-up    6 weeks     HPI  Jacob Weiss is a 57 y.o. 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   Patient was referred to the practice for blood pressure management.  He is working with his PCP and with titration of antihypertensive medications he would have episodes of lightheaded and dizziness.  At the last office visit I recommended that he take HCTZ in the morning and losartan at night.  This has helped improve his blood pressure on a daily basis.  Based on the blood pressure log that he provides SBP's ranging between 120-130 mmHg on average based on visual estimation.  Patient is thankful for the recommendations.  With regards to dyspnea on exertion the symptoms have improved with better blood pressure management.  He was recommended to have an echo and stress test.  Echocardiogram is still pending.  Stress test results reviewed overall low risk study.  He also had a coronary calcium score given his hyperlipidemia and he is noted to have minimal CAC with a total score of 6 placing him at the 43rd percentile per [facility] database.  FUNCTIONAL STATUS: Walks approximately 2 miles per week.  And maintain his lawn by using a push mower.  ALLERGIES: No Known Allergies  MEDICATION LIST PRIOR TO VISIT: Current Meds  Medication Sig   famotidine (PEPCID) 20 MG tablet Take 20 mg by mouth daily.   hydrochlorothiazide (HYDRODIURIL) 12.5 MG tablet Take 12.5 mg by mouth daily.   losartan (COZAAR) 50 MG tablet Take 50 mg by mouth daily.     PAST MEDICAL HISTORY: Past Medical History:  Diagnosis  Date   GERD (gastroesophageal reflux disease)    History of kidney stones    Hyperlipidemia    Hypertension    Left ureteral stone     PAST SURGICAL HISTORY: Past Surgical History:  Procedure Laterality Date   CYSTOSCOPY WITH RETROGRADE PYELOGRAM, URETEROSCOPY AND STENT PLACEMENT Left 06/28/2015   Procedure: CYSTOSCOPY WITH LEFT RETROGRADE PYELOGRAM, URETEROSCOPY LASER LITHO  AND STENT PLACEMENT;  Surgeon: Ihor Gully, MD;  Location: [facility] [place];  Service: Urology;  Laterality: Left;   EXTRACORPOREAL SHOCK WAVE LITHOTRIPSY  2007   LEFT URETEROSCOPIC STONE EXTRACTION W/  STENT PLACEMENT  09-02-2003    FAMILY HISTORY: The patient family history includes Hypertension in his mother; Kidney cancer in his father; Lung cancer in his sister; Stroke in his brother.  SOCIAL HISTORY:  The patient  reports that he has never smoked. He has never used smokeless tobacco. He reports that he does not drink alcohol and does not use drugs.  REVIEW OF SYSTEMS: Review of Systems  Cardiovascular:  Positive for dyspnea on exertion (Resolved). Negative for chest pain, claudication, irregular heartbeat, leg swelling, near-syncope, orthopnea, palpitations, paroxysmal nocturnal dyspnea and syncope.  Respiratory:  Negative for shortness of breath.   Hematologic/Lymphatic: Negative for bleeding problem.  Musculoskeletal:  Negative for muscle cramps and myalgias.  Neurological:  Negative for dizziness and light-headedness.    PHYSICAL EXAM:    06/15/2022    3:04 PM 05/02/2022    3:28 PM 05/02/2022  2:51 PM  Vitals with BMI  Height 6\' 1"   6\' 1"   Weight 223 lbs 3 oz  223 lbs 13 oz  BMI 29.45  29.53  Systolic 122 140 478  Diastolic 83 82 87  Pulse 79 78 76    Physical Exam  Constitutional: No distress.  Age appropriate, hemodynamically stable.   Neck: No JVD present.  Cardiovascular: Normal rate, regular rhythm, S1 normal, S2 normal, intact distal pulses and normal pulses. Exam reveals  no gallop, no S3 and no S4.  No murmur heard. Pulmonary/Chest: Effort normal and breath sounds normal. No stridor. He has no wheezes. He has no rales.  Abdominal: Soft. Bowel sounds are normal. He exhibits no distension. There is no abdominal tenderness.  Musculoskeletal:        General: No edema.     Cervical back: Neck supple.  Neurological: He is alert and oriented to person, place, and time. He has intact cranial nerves (2-12).  Skin: Skin is warm and moist.   CARDIAC DATABASE: EKG: 05/02/2022: Sinus rhythm, 71 bpm, left axis, left anterior fascicular block, LAE, without underlying injury pattern.  Echocardiogram: No results found for this or any previous visit from the past 1095 days.    Stress Testing: Exercise treadmill stress test 05/18/2022: Exercise treadmill stress test performed using Bruce protocol. Patient reached 10.1 METS, and 96% of age predicted maximum heart rate. Exercise capacity was good. No chest pain reported. Normal heart rate and hemodynamic response. Stress EKG revealed no ischemic changes. Low risk study.   Heart Catheterization: None  Cardiology  05/18/2022:  - Total Calcium Score: 6.  -- Left Main: 0.  -- Right Coronary: 0  -- Left Anterior Descending: 6.  -- Circumflex: 0.  -- PDA: 0  Non cardiac: irregular nodular opacity in the posterior right lung base with some associated groundglass (series 3, image 55   1.  Total calcium score of 6 is between the 25-50 percentile for males between the ages of 42-59.  2.  This is compatible with minimal identifiable coronary artery plaque.  Risk of coronary artery disease: very unlikely, less than 10%.  3.  Mixed nodular and groundglass opacities in the posterior right lung base, indeterminate though possibly infectious. Follow-up dedicated CT of the chest in 3 months is recommended.    LABORATORY DATA: External Labs: Collected: 01/2022. Total cholesterol 230, triglycerides 79, HDL 66, LDL calculated 150,  non-HDL 164. TSH 1.9. BUN 18, creatinine 1.06. Sodium 140, potassium 4, chloride 101, bicarb 31. AST 24, ALT 24, alkaline phosphatase 56. Hemoglobin 14.1, hematocrit 42.4%  IMPRESSION:    ICD-10-CM   1. Benign hypertension  I10     2. Dyspnea on exertion  R06.09     3. Mixed hyperlipidemia  E78.2        RECOMMENDATIONS: DENI LEFEVER is a 57 y.o. 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   Benign hypertension Office blood pressures are now well-controlled. Home blood pressures are also better controlled. Continue take HCTZ in the morning and losartan at night. Reemphasized importance of a low-salt diet.   No changes warranted at this time.  Dyspnea on exertion Resolved with improvement in benign essential hypertension. Echocardiogram is still pending. GXT low risk results reviewed with the patient and his wife at today's visit. CT calcium score noted nonspecific finding in the right lung results reviewed and I recommended that he follows up  with PCP and as recommended by radiology to have a repeat study in 3 months.  Both patient and wife are aware.  Mixed hyperlipidemia Most recent lipid profile notes a calculated LDL at 150 mg/dL His ASCVD risk score is approximately 6.3% based on current parameters.  Minimal coronary calcification, total CAC 6, 43rd percentile Patient wishes to hold off on statin therapy. Recommended that he implement lifestyle changes and have his cholesterol rechecked within the next 3 months with PCP to reevaluate the need for pharmacological therapy. Patient and wife are agreeable with the plan of care.  FINAL MEDICATION LIST END OF ENCOUNTER: No orders of the defined types were placed in this encounter.   There are no discontinued medications.    Current Outpatient Medications:    famotidine (PEPCID) 20 MG tablet, Take  20 mg by mouth daily., Disp: , Rfl:    hydrochlorothiazide (HYDRODIURIL) 12.5 MG tablet, Take 12.5 mg by mouth daily., Disp: , Rfl:    losartan (COZAAR) 50 MG tablet, Take 50 mg by mouth daily., Disp: , Rfl:    chlorpheniramine (CHLOR-TRIMETON) 4 MG tablet, Take 4 mg by mouth 2 (two) times daily as needed for allergies. (Patient not taking: Reported on 06/15/2022), Disp: , Rfl:   No orders of the defined types were placed in this encounter.  There are no Patient Instructions on file for this visit.   --Continue cardiac medications as reconciled in final medication list. --Return in about 1 year (around 06/15/2023) for Follow up, Dyspnea. or sooner if needed. --Continue follow-up with your primary care physician regarding the management of your other chronic comorbid conditions.  Patient's questions and concerns were addressed to his satisfaction. He voices understanding of the instructions provided during this encounter.   This note was created using a voice recognition software as a result there may be grammatical errors inadvertently enclosed that do not reflect the nature of this encounter. Every attempt is made to correct such errors.  Tessa Lerner, [place], [facility]  Pager:  [phone] Office: [phone]

## 2023-06-15 ENCOUNTER — Encounter: Payer: Self-pay | Admitting: Cardiology

## 2023-06-15 ENCOUNTER — Ambulatory Visit: Payer: Self-pay | Admitting: Cardiology

## 2023-06-15 ENCOUNTER — Ambulatory Visit: Payer: Self-pay | Attending: Cardiology | Admitting: Cardiology

## 2023-06-15 VITALS — BP 128/70 | HR 78 | Ht 73.0 in | Wt 224.0 lb

## 2023-06-15 DIAGNOSIS — R0609 Other forms of dyspnea: Secondary | ICD-10-CM

## 2023-06-15 DIAGNOSIS — E782 Mixed hyperlipidemia: Secondary | ICD-10-CM | POA: Diagnosis not present

## 2023-06-15 DIAGNOSIS — I10 Essential (primary) hypertension: Secondary | ICD-10-CM | POA: Diagnosis not present

## 2023-06-15 NOTE — Progress Notes (Signed)
 Cardiology Office Note:  .   Date:  06/15/2023  ID:  Jacob Weiss, DOB Aug 13, 1965, MRN [id number] PCP:  Rae Bugler, MD  Former Cardiology Providers: NA [facility] Providers Cardiologist:  Olinda Bertrand, DO , [facility] (established care 05/02/2022) Electrophysiologist:  None  Click to update primary MD,subspecialty MD or APP then REFRESH:1}    Chief Complaint  Patient presents with   Follow-up    1 year follow-up    History of Present Illness: .   Jacob Weiss is a 58 y.o. Caucasian male whose past medical history and cardiovascular risk factors includes:  Mild CAC (total CAC 6, 43rd percentile), hypertension, paresthesia, GERD, hyperlipidemia.   Patient was referred to the practice for evaluation and management of benign essential hypertension.  With changing how he was taking his medications patient's blood pressures improved significantly.  He was also experiencing shortness of breath with effort related activities initially felt to be secondary to uncontrolled hypertension.  However given his risk factors he did undergo GXT, and a coronary calcium score.  He has noted to have very minimal CAC with a total score of 6, placing him at the 43rd percentile, GXT was low risk.  He presents today for 1 year follow-up visit.  Since last office visit patient denies any anginal chest pain or heart failure symptoms.  No hospitalizations or urgent care visits for cardiovascular reasons.  He has been compliant with his medical therapy.  No significant weight gain.  Physical endurance remains stable.    Review of Systems: .   Review of Systems  Cardiovascular:  Negative for chest pain, claudication, irregular heartbeat, leg swelling, near-syncope, orthopnea, palpitations, paroxysmal nocturnal dyspnea and syncope.  Respiratory:  Negative for shortness of breath.   Hematologic/Lymphatic: Negative for bleeding problem.    Studies Reviewed:   EKG: EKG Interpretation Date/Time:  Friday Jun 15 2023 08:42:01 EDT Ventricular Rate:  78 PR Interval:  192 QRS Duration:  110 QT Interval:  390 QTC Calculation: 444 R Axis:   -66  Text Interpretation: Sinus rhythm with occasional Premature ventricular complexes Left anterior fascicular block Anterolateral infarct , age undetermined No previous ECGs available Confirmed by Olinda Bertrand [phone]) on 06/15/2023 8:59:19 AM  Echocardiogram: NA  Stress Testing: Exercise treadmill stress test 05/18/2022: Low risk study.  Coronary calcium score  05/18/2022:   Total Calcium Score: 6.  Non cardiac: irregular nodular opacity in the posterior right lung base with some associated groundglass (series 3, image 55   RADIOLOGY: NA  Risk Assessment/Calculations:   NA   Labs:    External Labs: Collected: 01/2022. Total cholesterol 230, triglycerides 79, HDL 66, LDL calculated 150, non-HDL 164. TSH 1.9. BUN 18, creatinine 1.06. Sodium 140, potassium 4, chloride 101, bicarb 31. AST 24, ALT 24, alkaline phosphatase 56. Hemoglobin 14.1, hematocrit 42.4%  External Labs: Collected: December 2024 [facility] database. Total cholesterol 230, triglycerides 70, HDL 65, LDL 153. Potassium 4.2. TSH 1.64  Physical Exam:    Today's Vitals   06/15/23 0843  BP: 128/70  Pulse: 78  SpO2: 97%  Weight: 224 lb (101.6 kg)  Height: 6\' 1"  (1.854 m)   Body mass index is 29.55 kg/m. Wt Readings from Last 3 Encounters:  06/15/23 224 lb (101.6 kg)  06/15/22 223 lb 3.2 oz (101.2 kg)  05/02/22 223 lb 12.8 oz (101.5 kg)    Physical Exam  Constitutional: No distress.  hemodynamically stable  Neck: No JVD present.  Cardiovascular: Normal rate, regular rhythm, S1 normal and  S2 normal. Exam reveals no gallop, no S3 and no S4.  No murmur heard. Pulmonary/Chest: Effort normal and breath sounds normal. No stridor. He has no wheezes. He has no rales.  Musculoskeletal:        General: No edema.     Cervical back: Neck supple.  Skin: Skin is warm.      Impression & Recommendation(s):  Impression:   ICD-10-CM   1. Dyspnea on exertion  R06.09 EKG 12-Lead    2. Benign hypertension  I10     3. Mixed hyperlipidemia  E78.2        Recommendation(s):  Dyspnea on exertion Chronic and stable. Significantly improved after blood pressure management. Prior GXT was low risk. Overall functional capacity remains relatively stable still uses a push mower on a regular basis to maintain his lawn. EKG illustrates sinus rhythm without underlying injury pattern.  Benign hypertension Chronic and well-controlled. Continue hydrochlorothiazide 12.5 mg p.o. every morning. Continue losartan 50 mg p.o. every afternoon. Reemphasized importance of low-salt diet.  Mixed hyperlipidemia Most recent LDL levels 153 mg/dL as per [facility] database from December 2024 Coronary calcium score in the past was 6. We have discussed lipid-lowering agents. Currently on over-the-counter medications but no significant improvement and follow-up lipid profile. I have advised him to reconsider this and discuss with PCP if he still continues to prefer over-the-counter supplements consider Red yeast rice as an alternative to pharmacological therapy.  However if Red Yeast Rice is till not effective than prescription drug management is a consideration  During his last coronary calcium score back in 2024 patient had an irregular nodular opacity in the posterior right lung base and had asked him to follow-up with PCP.  Patient still has not discussed this further with PCP.  Reemphasized its importance and follow-through.  Patient verbalizes understanding and his questions and concerns addressed to his satisfaction.   Orders Placed:  Orders Placed This Encounter  Procedures   EKG 12-Lead     Final Medication List:   No orders of the defined types were placed in this encounter.   There are no discontinued medications.   Current Outpatient Medications:    CINNAMON PO, Take  2 tablets by mouth daily., Disp: , Rfl:    famotidine (PEPCID) 20 MG tablet, Take 20 mg by mouth daily., Disp: , Rfl:    hydrochlorothiazide (HYDRODIURIL) 12.5 MG tablet, Take 12.5 mg by mouth daily., Disp: , Rfl:    losartan (COZAAR) 50 MG tablet, Take 50 mg by mouth daily., Disp: , Rfl:    Magnesium Bisglycinate 655 MG CAPS, Take 1 tablet by mouth 2 (two) times a week., Disp: , Rfl:    Multiple Vitamins-Minerals (MATURE ADULT CENTURY PO), Take 1 tablet by mouth daily., Disp: , Rfl:    NON FORMULARY, Take 2 tablets by mouth daily. Cholestoff Plus, Disp: , Rfl:    Omega-3 Fatty Acids (FISH OIL) 1000 MG CAPS, Take 2 capsules by mouth daily., Disp: , Rfl:    Thiamine HCl (VITAMIN B-1) 250 MG tablet, Take 250 mg by mouth 2 (two) times a week., Disp: , Rfl:    chlorpheniramine (CHLOR-TRIMETON) 4 MG tablet, Take 4 mg by mouth 2 (two) times daily as needed for allergies. (Patient not taking: Reported on 06/15/2023), Disp: , Rfl:   Consent:   NA  Disposition:   1 year follow-up sooner if needed  His questions and concerns were addressed to his satisfaction. He voices understanding of the recommendations provided during this encounter.  Signed, Olinda Bertrand, DO, [facility]  [facility]  06/15/2023 11:12 AM

## 2023-06-15 NOTE — Patient Instructions (Signed)
 Medication Instructions:  Your physician recommends that you continue on your current medications as directed. Please refer to the Current Medication list given to you today.  *If you need a refill on your cardiac medications before your next appointment, please call your pharmacy*  Lab Work: None ordered today. If you have labs (blood work) drawn today and your tests are completely normal, you will receive your results only by: MyChart Message (if you have MyChart) OR A paper copy in the mail If you have any lab test that is abnormal or we need to change your treatment, we will call you to review the results.  Testing/Procedures: None ordered today.  Follow-Up: At [facility], you and your health needs are our priority.  As part of our continuing mission to provide you with exceptional heart care, we have created designated Provider Care Teams.  These Care Teams include your primary Cardiologist (physician) and Advanced Practice Providers (APPs -  Physician Assistants and Nurse Practitioners) who all work together to provide you with the care you need, when you need it.  We recommend signing up for the patient portal called "MyChart".  Sign up information is provided on this After Visit Summary.  MyChart is used to connect with patients for Virtual Visits (Telemedicine).  Patients are able to view lab/test results, encounter notes, upcoming appointments, etc.  Non-urgent messages can be sent to your provider as well.   To learn more about what you can do with MyChart, go to [email or web address].    Your next appointment:   1 year(s)  The format for your next appointment:   In Person  Provider:   Olinda Bertrand, [facility]  Other Instructions Follow-up with primary care provider to discuss care coordination regarding your prior chest CT results.
